# Patient Record
Sex: Male | Born: 1955 | Race: White | Hispanic: No | Marital: Single | Smoking: Former smoker
Health system: Southern US, Community
[De-identification: ages and names within clinical notes are randomized; demographics above are authoritative.]

---

## 2001-01-27 ENCOUNTER — Encounter: Payer: Self-pay | Admitting: *Deleted

## 2001-01-27 ENCOUNTER — Emergency Department (HOSPITAL_COMMUNITY): Admission: EM | Admit: 2001-01-27 | Discharge: 2001-01-27 | Payer: Self-pay | Admitting: *Deleted

## 2004-03-18 ENCOUNTER — Ambulatory Visit (HOSPITAL_COMMUNITY): Admission: RE | Admit: 2004-03-18 | Discharge: 2004-03-18 | Payer: Self-pay | Admitting: Internal Medicine

## 2004-05-13 ENCOUNTER — Ambulatory Visit (HOSPITAL_COMMUNITY): Admission: RE | Admit: 2004-05-13 | Discharge: 2004-05-13 | Payer: Self-pay | Admitting: Optometry

## 2004-10-29 ENCOUNTER — Inpatient Hospital Stay (HOSPITAL_COMMUNITY): Admission: EM | Admit: 2004-10-29 | Discharge: 2004-10-30 | Payer: Self-pay | Admitting: Emergency Medicine

## 2004-10-29 ENCOUNTER — Ambulatory Visit: Payer: Self-pay | Admitting: *Deleted

## 2005-09-15 IMAGING — CR DG CHEST 2V
2 series · 2 of 2 positions shown · non-contrast
Comparison: 05/13/04 chest CT scan.

CLINICAL DATA: Chest pain. 
 TWO VIEW CHEST ? 10/29/04:

[view not recorded (1 of 2)]
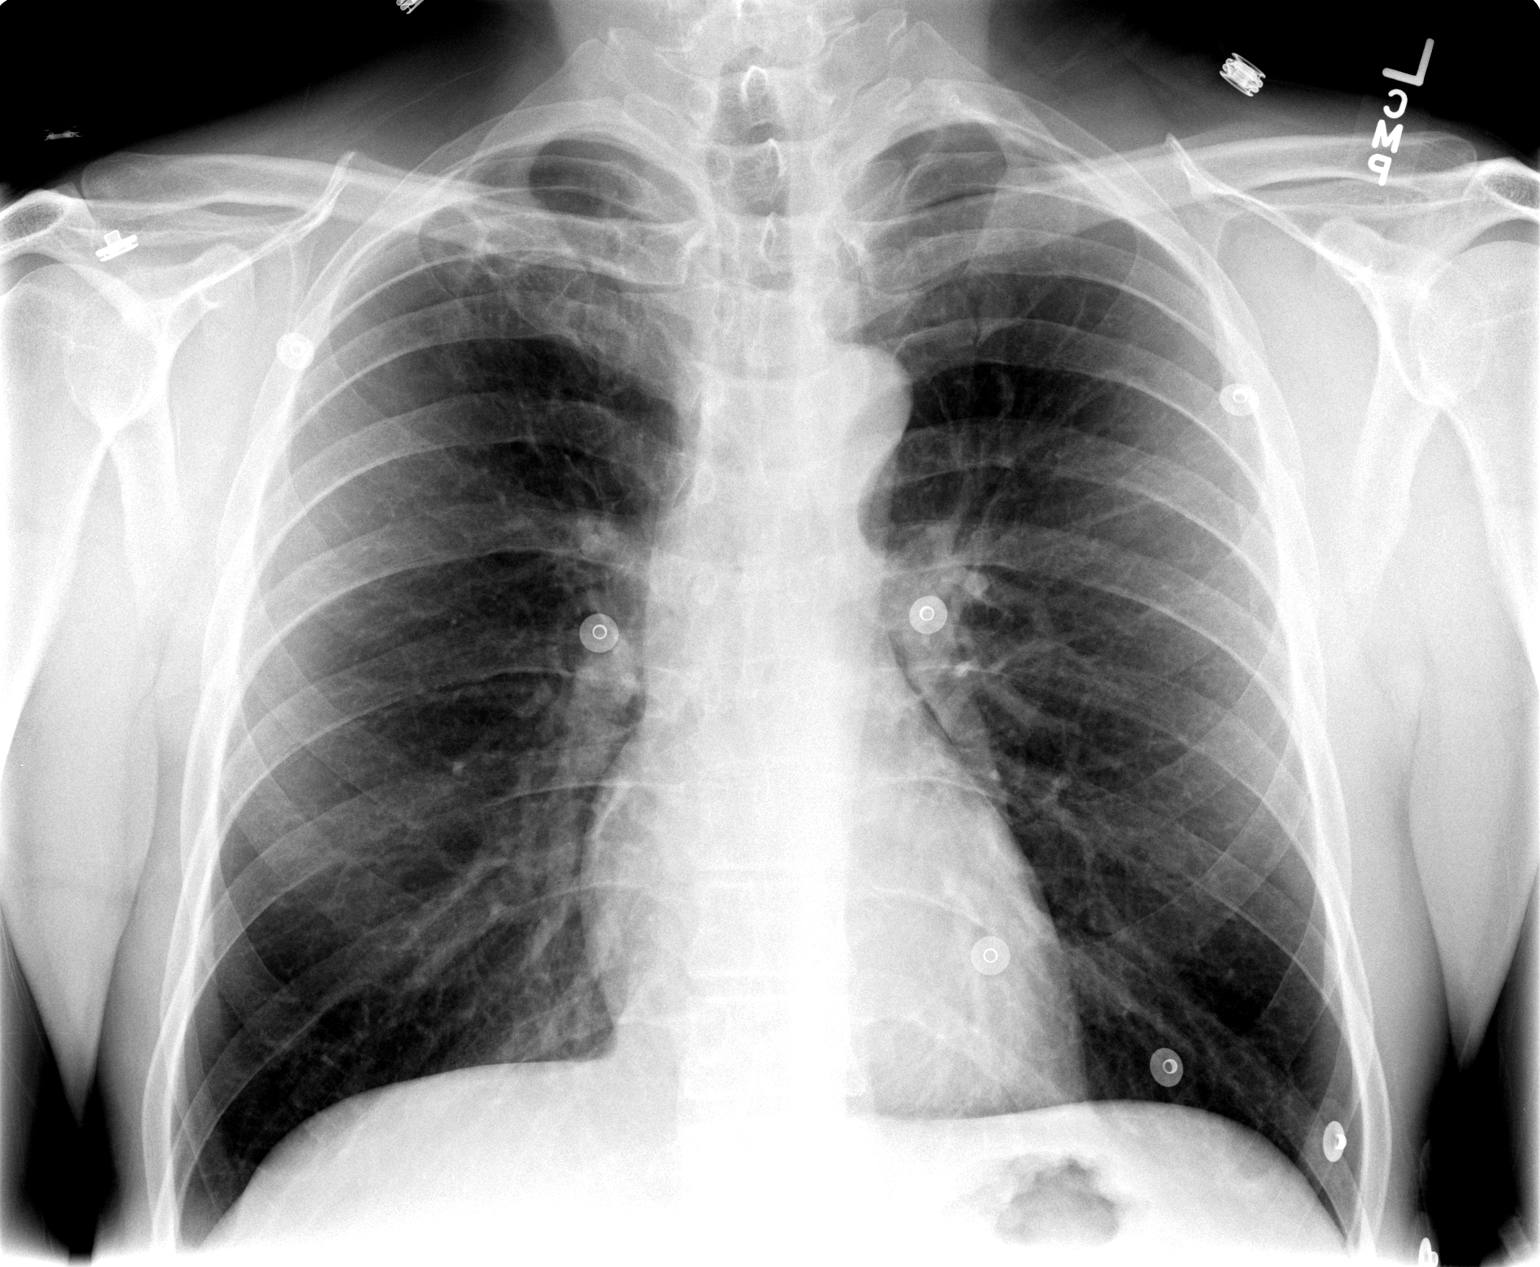

[view not recorded (2 of 2)]
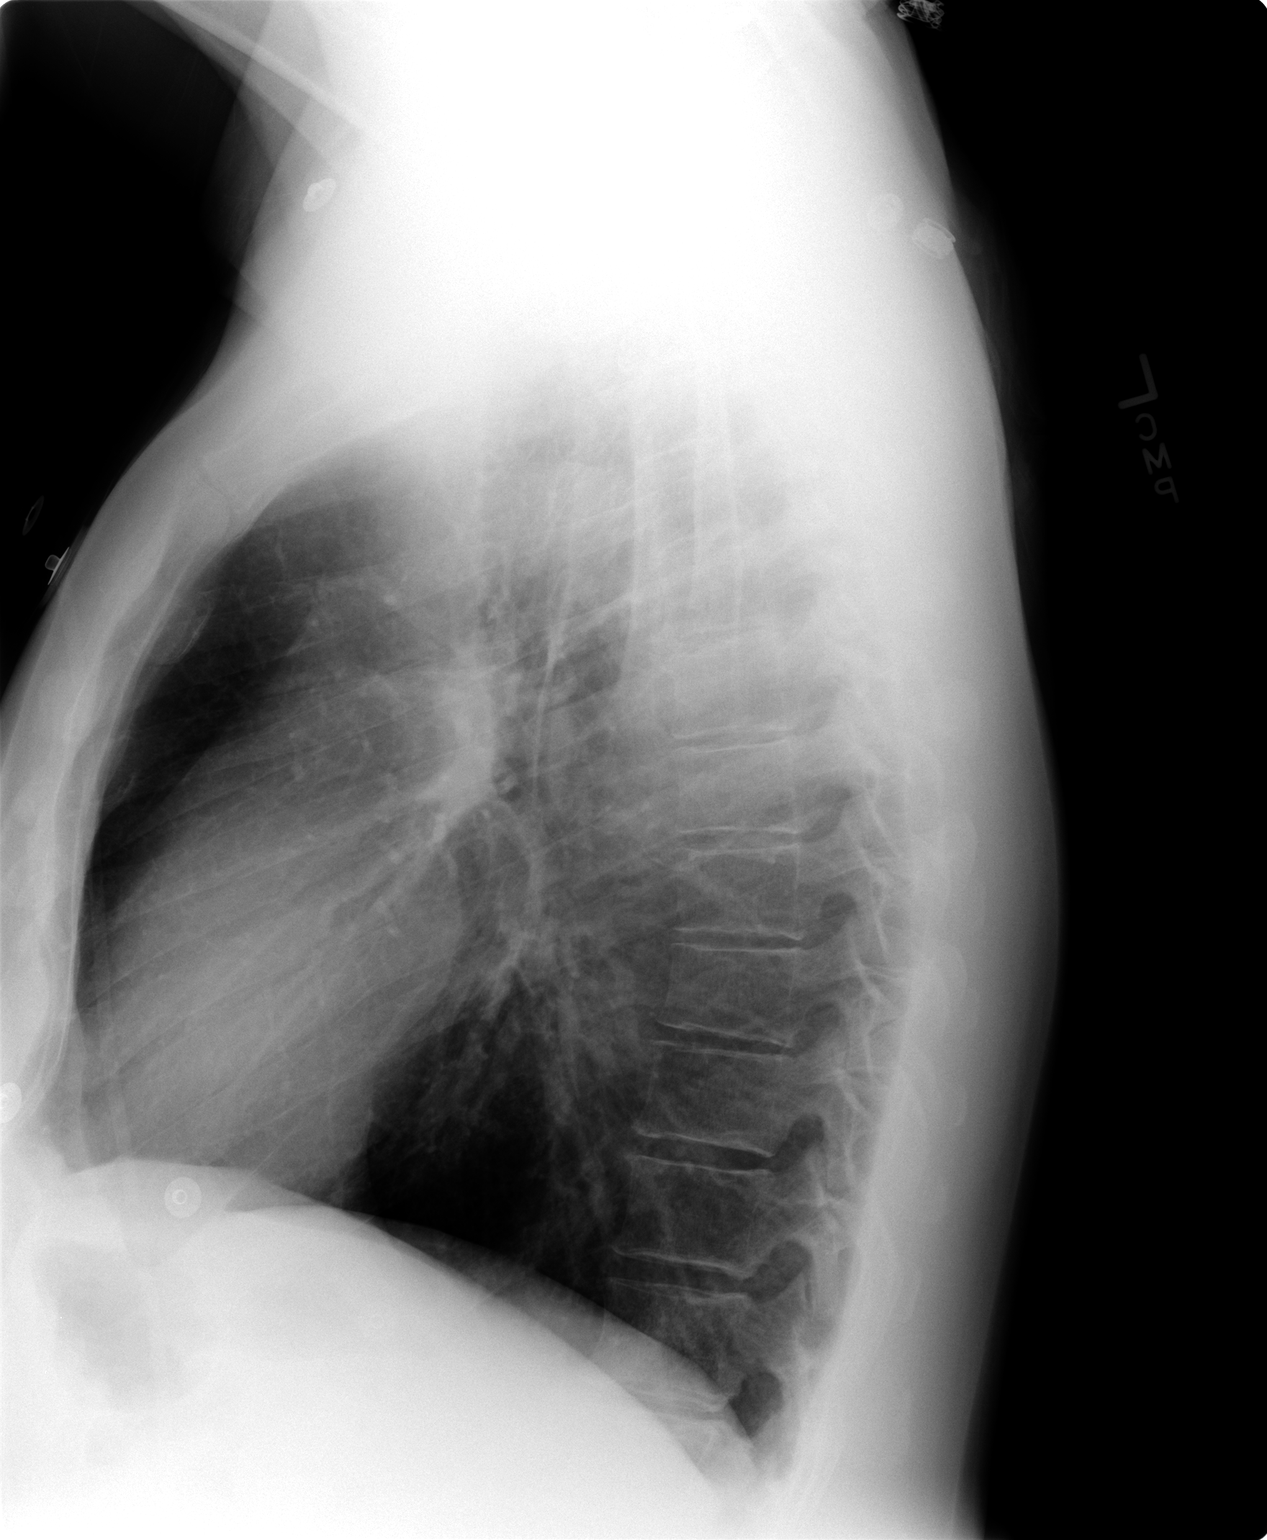

[2 of 2 positions shown; findings below may reference images not displayed]

The right upper lobe abscess cavity is markedly decreased in size and there is a small remaining air-filled bleb within the right lung apex.  There are no infiltrates.  The heart and mediastinal structures have a normal appearance.
IMPRESSION: 1.  Marked reduction in the cavitary area seen within the right lung apex with a small air-filled cystic area/bleb remaining within the right upper lobe.
 2.  No acute infiltrative or edematous changes.

## 2020-06-13 ENCOUNTER — Ambulatory Visit: Payer: Self-pay | Attending: Internal Medicine

## 2020-06-13 ENCOUNTER — Other Ambulatory Visit: Payer: Self-pay

## 2020-06-13 DIAGNOSIS — Z23 Encounter for immunization: Secondary | ICD-10-CM

## 2020-06-13 NOTE — Progress Notes (Signed)
   Covid-19 Vaccination Clinic  Name:  Victor Bowen    MRN: [id number] DOB: [birth year]  06/13/2020  Victor Bowen was observed post Covid-19 immunization for 15 minutes without incident. He was provided with Vaccine Information Sheet and instruction to access the V-Safe system.   Victor Bowen was instructed to call 911 with any severe reactions post vaccine: Marland Kitchen Difficulty breathing  . Swelling of face and throat  . A fast heartbeat  . A bad rash all over body  . Dizziness and weakness   Immunizations Administered    Name Date Dose VIS Date Route   Pfizer COVID-19 Vaccine 06/13/2020 10:54 AM 0.3 mL 11/16/2018 Intramuscular   Manufacturer: [employer], [employer]   Lot: [id number]   NDC: [address]-7

## 2020-07-04 ENCOUNTER — Other Ambulatory Visit: Payer: Self-pay

## 2020-07-04 ENCOUNTER — Ambulatory Visit: Payer: Self-pay | Attending: Internal Medicine

## 2020-07-04 DIAGNOSIS — Z23 Encounter for immunization: Secondary | ICD-10-CM

## 2020-07-04 NOTE — Progress Notes (Signed)
   Covid-19 Vaccination Clinic  Name:  Victor Bowen    MRN: [id number] DOB: Jan 17, 1956  07/04/2020  [name] was observed post Covid-19 immunization for 15 minutes without incident. He was provided with Vaccine Information Sheet and instruction to access the V-Safe system.   [name] was instructed to call 911 with any severe reactions post vaccine: Marland Kitchen Difficulty breathing  . Swelling of face and throat  . A fast heartbeat  . A bad rash all over body  . Dizziness and weakness   Immunizations Administered    Name Date Dose VIS Date Route   Pfizer COVID-19 Vaccine 07/04/2020 10:39 AM 0.3 mL 11/16/2018 Intramuscular   Manufacturer: [employer], [employer]   Lot: [id number]   NDC: [address]-9

## 2024-01-21 ENCOUNTER — Emergency Department (HOSPITAL_COMMUNITY): Payer: Self-pay

## 2024-01-21 ENCOUNTER — Other Ambulatory Visit: Payer: Self-pay

## 2024-01-21 ENCOUNTER — Encounter (HOSPITAL_COMMUNITY): Payer: Self-pay | Admitting: Emergency Medicine

## 2024-01-21 ENCOUNTER — Inpatient Hospital Stay (HOSPITAL_COMMUNITY)
Admission: EM | Admit: 2024-01-21 | Discharge: 2024-01-23 | DRG: 441 | Disposition: A | Discharge status: Hospice | Payer: Self-pay | Attending: Internal Medicine | Admitting: Internal Medicine

## 2024-01-21 ENCOUNTER — Inpatient Hospital Stay (HOSPITAL_COMMUNITY): Payer: Self-pay

## 2024-01-21 DIAGNOSIS — Z515 Encounter for palliative care: Secondary | ICD-10-CM | POA: Diagnosis not present

## 2024-01-21 DIAGNOSIS — I81 Portal vein thrombosis: Secondary | ICD-10-CM | POA: Diagnosis present

## 2024-01-21 DIAGNOSIS — C787 Secondary malignant neoplasm of liver and intrahepatic bile duct: Secondary | ICD-10-CM | POA: Diagnosis present

## 2024-01-21 DIAGNOSIS — I2489 Other forms of acute ischemic heart disease: Secondary | ICD-10-CM | POA: Diagnosis present

## 2024-01-21 DIAGNOSIS — C799 Secondary malignant neoplasm of unspecified site: Secondary | ICD-10-CM | POA: Diagnosis present

## 2024-01-21 DIAGNOSIS — K7290 Hepatic failure, unspecified without coma: Principal | ICD-10-CM

## 2024-01-21 DIAGNOSIS — E43 Unspecified severe protein-calorie malnutrition: Secondary | ICD-10-CM | POA: Diagnosis present

## 2024-01-21 DIAGNOSIS — J439 Emphysema, unspecified: Secondary | ICD-10-CM | POA: Diagnosis present

## 2024-01-21 DIAGNOSIS — E872 Acidosis, unspecified: Secondary | ICD-10-CM | POA: Insufficient documentation

## 2024-01-21 DIAGNOSIS — D72829 Elevated white blood cell count, unspecified: Secondary | ICD-10-CM | POA: Insufficient documentation

## 2024-01-21 DIAGNOSIS — C3490 Malignant neoplasm of unspecified part of unspecified bronchus or lung: Secondary | ICD-10-CM | POA: Diagnosis not present

## 2024-01-21 DIAGNOSIS — Z66 Do not resuscitate: Secondary | ICD-10-CM | POA: Diagnosis present

## 2024-01-21 DIAGNOSIS — Z6825 Body mass index (BMI) 25.0-25.9, adult: Secondary | ICD-10-CM

## 2024-01-21 DIAGNOSIS — E871 Hypo-osmolality and hyponatremia: Secondary | ICD-10-CM | POA: Diagnosis present

## 2024-01-21 DIAGNOSIS — R627 Adult failure to thrive: Secondary | ICD-10-CM | POA: Insufficient documentation

## 2024-01-21 DIAGNOSIS — Z87891 Personal history of nicotine dependence: Secondary | ICD-10-CM | POA: Diagnosis not present

## 2024-01-21 DIAGNOSIS — K763 Infarction of liver: Secondary | ICD-10-CM | POA: Diagnosis present

## 2024-01-21 DIAGNOSIS — K7200 Acute and subacute hepatic failure without coma: Principal | ICD-10-CM | POA: Diagnosis present

## 2024-01-21 DIAGNOSIS — R188 Other ascites: Secondary | ICD-10-CM | POA: Insufficient documentation

## 2024-01-21 DIAGNOSIS — R7989 Other specified abnormal findings of blood chemistry: Secondary | ICD-10-CM | POA: Insufficient documentation

## 2024-01-21 DIAGNOSIS — C7801 Secondary malignant neoplasm of right lung: Secondary | ICD-10-CM | POA: Diagnosis present

## 2024-01-21 DIAGNOSIS — D689 Coagulation defect, unspecified: Secondary | ICD-10-CM | POA: Insufficient documentation

## 2024-01-21 DIAGNOSIS — R590 Localized enlarged lymph nodes: Secondary | ICD-10-CM | POA: Diagnosis present

## 2024-01-21 DIAGNOSIS — J90 Pleural effusion, not elsewhere classified: Secondary | ICD-10-CM | POA: Diagnosis present

## 2024-01-21 DIAGNOSIS — R64 Cachexia: Secondary | ICD-10-CM | POA: Diagnosis present

## 2024-01-21 DIAGNOSIS — Z7189 Other specified counseling: Secondary | ICD-10-CM

## 2024-01-21 DIAGNOSIS — C3491 Malignant neoplasm of unspecified part of right bronchus or lung: Secondary | ICD-10-CM | POA: Diagnosis not present

## 2024-01-21 DIAGNOSIS — N179 Acute kidney failure, unspecified: Secondary | ICD-10-CM | POA: Diagnosis present

## 2024-01-21 DIAGNOSIS — F1010 Alcohol abuse, uncomplicated: Secondary | ICD-10-CM | POA: Diagnosis present

## 2024-01-21 DIAGNOSIS — J929 Pleural plaque without asbestos: Secondary | ICD-10-CM | POA: Insufficient documentation

## 2024-01-21 DIAGNOSIS — I871 Compression of vein: Secondary | ICD-10-CM | POA: Insufficient documentation

## 2024-01-21 LAB — COMPREHENSIVE METABOLIC PANEL WITH GFR
ALT: 263 U/L — ABNORMAL HIGH (ref 0–44)
AST: 508 U/L — ABNORMAL HIGH (ref 15–41)
Albumin: 2.4 g/dL — ABNORMAL LOW (ref 3.5–5.0)
Alkaline Phosphatase: 1495 U/L — ABNORMAL HIGH (ref 38–126)
Anion gap: 21 — ABNORMAL HIGH (ref 5–15)
BUN: 66 mg/dL — ABNORMAL HIGH (ref 8–23)
CO2: 14 mmol/L — ABNORMAL LOW (ref 22–32)
Calcium: 9.3 mg/dL (ref 8.9–10.3)
Chloride: 88 mmol/L — ABNORMAL LOW (ref 98–111)
Creatinine, Ser: 1.62 mg/dL — ABNORMAL HIGH (ref 0.61–1.24)
GFR, Estimated: 46 mL/min — ABNORMAL LOW (ref >60)
Glucose, Bld: 92 mg/dL (ref 70–99)
Potassium: 5.1 mmol/L (ref 3.5–5.1)
Sodium: 123 mmol/L — ABNORMAL LOW (ref 135–145)
Total Bilirubin: 20.2 mg/dL (ref 0.0–1.2)
Total Protein: 6.2 g/dL — ABNORMAL LOW (ref 6.5–8.1)

## 2024-01-21 LAB — CBC WITH DIFFERENTIAL/PLATELET
Abs Immature Granulocytes: 0.62 10*3/uL — ABNORMAL HIGH (ref 0.00–0.07)
Basophils Absolute: 0.1 10*3/uL (ref 0.0–0.1)
Basophils Relative: 0 %
Eosinophils Absolute: 0 10*3/uL (ref 0.0–0.5)
Eosinophils Relative: 0 %
HCT: 37.8 % — ABNORMAL LOW (ref 39.0–52.0)
Hemoglobin: 12.8 g/dL — ABNORMAL LOW (ref 13.0–17.0)
Immature Granulocytes: 4 %
Lymphocytes Relative: 10 %
Lymphs Abs: 1.7 10*3/uL (ref 0.7–4.0)
MCH: 29.8 pg (ref 26.0–34.0)
MCHC: 33.9 g/dL (ref 30.0–36.0)
MCV: 88.1 fL (ref 80.0–100.0)
Monocytes Absolute: 1.3 10*3/uL — ABNORMAL HIGH (ref 0.1–1.0)
Monocytes Relative: 8 %
Neutro Abs: 13.4 10*3/uL — ABNORMAL HIGH (ref 1.7–7.7)
Neutrophils Relative %: 78 %
Platelets: 295 10*3/uL (ref 150–400)
RBC: 4.29 MIL/uL (ref 4.22–5.81)
RDW: 20.6 % — ABNORMAL HIGH (ref 11.5–15.5)
WBC: 17.1 10*3/uL — ABNORMAL HIGH (ref 4.0–10.5)
nRBC: 0.1 % (ref 0.0–0.2)

## 2024-01-21 LAB — OSMOLALITY: Osmolality: 297 mosm/kg — ABNORMAL HIGH (ref 275–295)

## 2024-01-21 LAB — URINALYSIS, ROUTINE W REFLEX MICROSCOPIC
Bacteria, UA: NONE SEEN
Glucose, UA: NEGATIVE mg/dL
Hgb urine dipstick: NEGATIVE
Ketones, ur: 5 mg/dL — AB
Leukocytes,Ua: NEGATIVE
Nitrite: NEGATIVE
Protein, ur: 30 mg/dL — AB
Specific Gravity, Urine: 1.035 — ABNORMAL HIGH (ref 1.005–1.030)
pH: 5 (ref 5.0–8.0)

## 2024-01-21 LAB — BLOOD GAS, VENOUS
Acid-base deficit: 10 mmol/L — ABNORMAL HIGH (ref 0.0–2.0)
Bicarbonate: 13.8 mmol/L — ABNORMAL LOW (ref 20.0–28.0)
Drawn by: 7012
O2 Saturation: 93.5 %
Patient temperature: 36.7
pCO2, Ven: 25 mmHg — ABNORMAL LOW (ref 44–60)
pH, Ven: 7.35 (ref 7.25–7.43)
pO2, Ven: 65 mmHg — ABNORMAL HIGH (ref 32–45)

## 2024-01-21 LAB — BRAIN NATRIURETIC PEPTIDE: B Natriuretic Peptide: 126 pg/mL — ABNORMAL HIGH (ref 0.0–100.0)

## 2024-01-21 LAB — LACTIC ACID, PLASMA
Lactic Acid, Venous: 4.9 mmol/L (ref 0.5–1.9)
Lactic Acid, Venous: 4.9 mmol/L (ref 0.5–1.9)

## 2024-01-21 LAB — PROTIME-INR
INR: 1.3 — ABNORMAL HIGH (ref 0.8–1.2)
Prothrombin Time: 16.6 s — ABNORMAL HIGH (ref 11.4–15.2)

## 2024-01-21 LAB — SODIUM, URINE, RANDOM: Sodium, Ur: 18 mmol/L

## 2024-01-21 LAB — TROPONIN I (HIGH SENSITIVITY)
Troponin I (High Sensitivity): 20 ng/L — ABNORMAL HIGH (ref <18)
Troponin I (High Sensitivity): 18 ng/L — ABNORMAL HIGH (ref <18)

## 2024-01-21 MED ORDER — GADOBUTROL 1 MMOL/ML IV SOLN
8.0000 mL | Freq: Once | INTRAVENOUS | Status: AC | PRN
  Administered 2024-01-21: 8 mL via INTRAVENOUS

## 2024-01-21 MED ORDER — LORAZEPAM 0.5 MG PO TABS
0.5000 mg | ORAL_TABLET | Freq: Four times a day (QID) | ORAL | Status: DC | PRN
  Administered 2024-01-21 – 2024-01-22 (×2): 0.5 mg via ORAL
  Filled 2024-01-21 (×2): qty 1

## 2024-01-21 MED ORDER — ALBUTEROL SULFATE (2.5 MG/3ML) 0.083% IN NEBU: 2.5000 mg | INHALATION_SOLUTION | RESPIRATORY_TRACT | Status: DC | PRN

## 2024-01-21 MED ORDER — GLYCOPYRROLATE 1 MG PO TABS: 1.0000 mg | ORAL_TABLET | ORAL | Status: DC | PRN

## 2024-01-21 MED ORDER — GLYCOPYRROLATE 0.2 MG/ML IJ SOLN: 0.2000 mg | INTRAMUSCULAR | Status: DC | PRN

## 2024-01-21 MED ORDER — HYDROMORPHONE BOLUS VIA INFUSION: 0.5000 mg | INTRAVENOUS | Status: DC | PRN

## 2024-01-21 MED ORDER — IOHEXOL 300 MG/ML  SOLN
75.0000 mL | Freq: Once | INTRAMUSCULAR | Status: AC | PRN
  Administered 2024-01-21: 75 mL via INTRAVENOUS

## 2024-01-21 MED ORDER — ONDANSETRON 4 MG PO TBDP
4.0000 mg | ORAL_TABLET | Freq: Four times a day (QID) | ORAL | Status: DC | PRN
  Administered 2024-01-23: 4 mg via ORAL
  Filled 2024-01-21: qty 1

## 2024-01-21 MED ORDER — PIPERACILLIN-TAZOBACTAM 3.375 G IVPB
3.3750 g | Freq: Three times a day (TID) | INTRAVENOUS | Status: DC
  Administered 2024-01-21 – 2024-01-22 (×3): 3.375 g via INTRAVENOUS
  Filled 2024-01-21 (×3): qty 50

## 2024-01-21 MED ORDER — HYDROMORPHONE HCL 1 MG/ML IJ SOLN
0.5000 mg | INTRAMUSCULAR | Status: DC | PRN
  Administered 2024-01-22 – 2024-01-23 (×4): 0.5 mg via INTRAVENOUS
  Filled 2024-01-21 (×4): qty 0.5

## 2024-01-21 MED ORDER — ACETAMINOPHEN 325 MG PO TABS: 650.0000 mg | ORAL_TABLET | Freq: Four times a day (QID) | ORAL | Status: DC | PRN

## 2024-01-21 MED ORDER — PANTOPRAZOLE SODIUM 40 MG PO TBEC
40.0000 mg | DELAYED_RELEASE_TABLET | Freq: Every day | ORAL | Status: DC
  Administered 2024-01-21 – 2024-01-23 (×2): 40 mg via ORAL
  Filled 2024-01-21 (×2): qty 1

## 2024-01-21 MED ORDER — ACETAMINOPHEN 650 MG RE SUPP: 650.0000 mg | Freq: Four times a day (QID) | RECTAL | Status: DC | PRN

## 2024-01-21 MED ORDER — ONDANSETRON HCL 4 MG/2ML IJ SOLN: 4.0000 mg | Freq: Four times a day (QID) | INTRAMUSCULAR | Status: DC | PRN

## 2024-01-21 MED ORDER — POLYVINYL ALCOHOL 1.4 % OP SOLN: 1.0000 [drp] | Freq: Four times a day (QID) | OPHTHALMIC | Status: DC | PRN

## 2024-01-21 NOTE — Hospital Course (Addendum)
 68 year old male with significant history of smoking-30 pack years quit about 3 and half years ago and alcohol  use quit few months ago, no known past medical history but has not seen doctor in many years presenting for evaluation of several months of exertional dyspnea, cough abdominal bloating and yesterday noticed loose discoloration of skin and leg edema.  Patient went to urgent care twice and was given antibiotics without relief of symptoms and presented to the ED. Patient appears swollen and bloated appears uncomfortable, cachectic and pain.  Endorses weight loss. He has abdominal discomfort.Denies any nausea vomiting fever chills focal weakness. In the ED: Vitals-hemodynamically stable afebrile.Labs as below showing hyponatremia acute renal failure, transaminitis jaundice,hyponatremia : Na 123 potassium 5.1 bicarb 14 BUN 66 and creatinine 1.6 alk phos 1495 AST 508 ALT 263 total bili 20.2, CBC with leukocytosis 17K stable hb/platelet, VBG 7.3 pCO2 25. Patient appeared jaundiced with leg edema. 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 His MELD score around 31 per EDP Patient and sister at bedside after hearing the findings-does not want to go through any biopsy or bronchoscopy and wants to be comfortable DNR.Hospitalist was consulted. GI Dr. Andy Keen was consulted>  MRCP obtained to see if any role of stenting of bile duct-empiric Zosyn  was initiated pending MRI. Palliative care was consulted as well. 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  Portal lymphadenopathy noticed> discussed with GI no role of further testing or procedure at this time and advised hospice. Patient and family agreed for hospice [facility] consulted for hospice referral after extensive palliative care evaluation

## 2024-01-21 NOTE — ED Triage Notes (Signed)
 Pt c/o ongoing shob x 1 month, was previously Dx with an URI and has completed all ABX for that, but states it has not resolved his shob. Pt also c/o bilateral lower extremity swelling x "few days", 3+ pitting edema noted to bilateral lower legs.pt also appears jaundice upon visual assessment, when asked if pt has any liver issues etc.--Pt denies, Family member at bedside states "I just notcied his yellow skin yesterday". Denies CP with the exception of when he coughs, endorses dizziness upon standing

## 2024-01-21 NOTE — ED Notes (Signed)
 Pt went to quick care prior to coming to ER, paperwork is in the pt's room with the dx.

## 2024-01-21 NOTE — Consult Note (Signed)
 Consultation Note Date: 01/21/2024   Patient Name: Victor Bowen  DOB: [birth year]  MRN: [id number]  Age / Sex: 68 y.o., male  PCP: Pcp, No Referring Physician: Lesa Rape, MD  Reason for Consultation: Establishing goals of care  HPI/Patient Profile: 68 y.o. male  with past medical history of smoking-30 pack years quit about 3 and half years ago and alcohol  use quit few months ago, no known past medical history  admitted on 01/21/2024 with acute liver failure, metastatic lung cancer.   Clinical Assessment and Goals of Care: I have reviewed medical records including EPIC notes, labs and imaging, received report from RN, assessed the patient.  Mr. Moga is sitting up in the [facility] chair in the ED.  He greets me, making and mostly keeping eye contact.  He is alert and oriented, able to make his needs known.  His sister, Mitzi Ancona, and his longtime friend, Nadia Aurora, are present at bedside.  We meet at the bedside to discuss diagnosis prognosis, GOC, EOL wishes, disposition and options.  I introduced Palliative Medicine as specialized medical care for people living with serious illness. It focuses on providing relief from the symptoms and stress of a serious illness. The goal is to improve quality of life for both the patient and the family.  We focused on their current illness.  We talk in detail about Mr. Hames hard news today.  We talked about images showing right lower lobe lung cancer and what is considered to be diffuse liver metastasis.  We talk in detail about the choice for MRI/MRCP that can be done here at [facility].  I shared that the diagnostic images will show if his bile duct would accept a palliative stent to improve his quality of life.  I shared that stenting must be done in [place], but he could return to [facility].  We also talked about diagnostics to understand more  about main portal vein occlusion whether this is from cancer or a blood clot.  I shared that part of his liver is dying, that if the blood vessel is being cut off by tumor that time is likely short, but if there is a blood clot closing the blood vessel then this may be treated with blood thinners.  We talked about time for diagnostics and options.  Family is tearful but knowledgeable about options and the treatment plan the natural disease trajectory and expectations at EOL were discussed.  Advanced directives, concepts specific to code status, artifical feeding and hydration, and rehospitalization were considered and discussed.  Open to some diagnostics, but no further invasive cancer testing at this point.  Anticipate open to stenting if will provide benefit.  Hospice and Palliative Care services outpatient were explained and offered.  Mr. Standing states that he has a friend who works at hospice.  Family and friend shared that they have experience with hospice services.  We talked about provider choice.  They choose [facility].  We talked about the benefits of residential hospice if/when needed.  Discussed the importance of continued conversation with family and the medical providers regarding overall plan of care and treatment options, ensuring decisions are within the context of the patient's values and GOCs. Questions and concerns were addressed.  The family was encouraged to call with questions or concerns.  PMT will continue to support holistically.  Conference with attending, bedside nursing staff, transition of care team related to patient condition, needs, goals of care, disposition.   HCPOA NEXT OF KIN -sisters Alethea Hutchinson and Edwina Gram    SUMMARY OF RECOMMENDATIONS   DNR verified Open to MRI/MRCP for palliative treatment Home with the benefit of [facility] hospice care    Code Status/Advance Care Planning: DNR  Symptom Management:  Per hospitalist, no additional needs at this time.  Palliative  Prophylaxis:  Frequent Pain Assessment and Oral Care  Additional Recommendations (Limitations, Scope, Preferences): No further cancer diagnostics but open to possible common bile duct stenting.  DNR  Psycho-social/Spiritual:  Desire for further Chaplaincy support:no Additional Recommendations: Caregiving  Support/Resources and Grief/Bereavement Support  Prognosis:  < 3 months, would not be surprising based on acuity of condition.  If tumor, not blood clot, is occluding portal vein then weeks anticipated.  Discharge Planning: Home with Hospice      Primary Diagnoses: Present on Admission:  Metastatic disease (HCC)  Acute liver failure   I have reviewed the medical record, interviewed the patient and family, and examined the patient. The following aspects are pertinent.  History reviewed. No pertinent past medical history. Social History   Socioeconomic History   Marital status: Single    Spouse name: Not on file   Number of children: Not on file   Years of education: Not on file   Highest education level: Not on file  Occupational History   Not on file  Tobacco Use   Smoking status: Former    Types: Cigarettes   Smokeless tobacco: Not on file  Substance and Sexual Activity   Alcohol  use: Not on file   Drug use: Not on file   Sexual activity: Not on file  Other Topics Concern   Not on file  Social History Narrative   Not on file   Social Drivers of Health   Financial Resource Strain: Not on file  Food Insecurity: Not on file  Transportation Needs: Not on file  Physical Activity: Not on file  Stress: Not on file  Social Connections: Not on file   History reviewed. No pertinent family history. Scheduled Meds:  pantoprazole   40 mg Oral Daily   Continuous Infusions: PRN Meds:.acetaminophen  **OR** acetaminophen , albuterol , glycopyrrolate  **OR** glycopyrrolate  **OR** glycopyrrolate , HYDROmorphone  (DILAUDID ) injection, LORazepam , ondansetron  **OR** ondansetron   (ZOFRAN ) IV, polyvinyl alcohol  Medications Prior to Admission:  Prior to Admission medications   Not on File   Not on File Review of Systems  Unable to perform ROS: Acuity of condition    Physical Exam Vitals and nursing note reviewed.  Constitutional:      General: He is not in acute distress.    Appearance: He is ill-appearing.  HENT:     Head: Normocephalic and atraumatic.  Cardiovascular:     Rate and Rhythm: Normal rate.  Pulmonary:     Effort: Pulmonary effort is normal. No tachypnea.  Skin:    General: Skin is warm and dry.     Comments: Jaundiced  Neurological:     Mental Status: He is alert and oriented to person, place, and time.  Psychiatric:        Mood and  Affect: Mood normal.        Behavior: Behavior normal.     Vital Signs: BP (!) 146/85   Pulse 86   Temp 98.5 F (36.9 C)   Resp 20   Ht 5' 10.5" (1.791 m)   Wt 81.6 kg   SpO2 91%   BMI 25.46 kg/m  Pain Scale: 0-10   Pain Score: 0-No pain   SpO2: SpO2: 91 % O2 Device:SpO2: 91 % O2 Flow Rate: .   IO: Intake/output summary: No intake or output data in the 24 hours ending 01/21/24 1543  LBM:   Baseline Weight: Weight: 81.6 kg Most recent weight: Weight: 81.6 kg     Palliative Assessment/Data:     Time In: 1500  Time Out: 1615 Time Total: 75 minutes  Greater than 50%  of this time was spent counseling and coordinating care related to the above assessment and plan.  Signed by: Annabelle Barrack, NP   Please contact Palliative Medicine Team phone at [phone] for questions and concerns.  For individual provider: See Tilford Foley

## 2024-01-21 NOTE — ED Provider Notes (Signed)
 [place] EMERGENCY DEPARTMENT AT [facility] Provider Note   CSN: [id number] Arrival date & time: 01/21/24  0940     History  Chief Complaint  Patient presents with   Shortness of Breath    Victor Bowen is a 68 y.o. male. He has no known chronic medical conditions but does not get regular medical care.  He is here for evaluation today of several months of exertional dyspnea and cough, with abdominal bloating as well without pain.  He noticed yesterday that his skin was yellow and this morning woke up with lower extremity edema bilaterally.  No chest pain, no fevers.  He has had a cough productive of clear sputum.  He went to urgent care twice and states he was given antibiotics without relief of symptoms.  Patient notes he was a long-term smoker but quit about 2 and half years ago.  He also previously drank about 6 beers a day until approximately 2 months ago when he stopped.   Shortness of Breath      Home Medications Prior to Admission medications   Not on File      Allergies    Patient has no allergy information on record.    Review of Systems   Review of Systems  Respiratory:  Positive for shortness of breath.     Physical Exam Updated Vital Signs BP (!) 146/85   Pulse 86   Temp 98.5 F (36.9 C)   Resp 20   Ht 5' 10.5" (1.791 m)   Wt 81.6 kg   SpO2 91%   BMI 25.46 kg/m  Physical Exam Vitals and nursing note reviewed.  Constitutional:      General: He is not in acute distress.    Appearance: He is well-developed.  HENT:     Head: Normocephalic and atraumatic.     Mouth/Throat:     Mouth: Mucous membranes are moist.     Dentition: Dental caries present.     Pharynx: Oropharynx is clear.  Eyes:     Extraocular Movements: Extraocular movements intact.     Conjunctiva/sclera: Conjunctivae normal.     Pupils: Pupils are equal, round, and reactive to light.  Cardiovascular:     Rate and Rhythm: Normal rate and regular rhythm.     Heart  sounds: No murmur heard. Pulmonary:     Effort: Pulmonary effort is normal. No respiratory distress.     Breath sounds: Normal breath sounds.  Abdominal:     Palpations: Abdomen is soft.     Tenderness: There is no abdominal tenderness.  Musculoskeletal:        General: No swelling.     Cervical back: Full passive range of motion without pain and neck supple.     Right lower leg: No tenderness. Edema present.     Left lower leg: No tenderness. Edema present.  Lymphadenopathy:     Cervical: No cervical adenopathy.  Skin:    General: Skin is warm and dry.     Capillary Refill: Capillary refill takes less than 2 seconds.     Coloration: Skin is jaundiced.     Findings: No ecchymosis.     Nails: There is no clubbing.  Neurological:     General: No focal deficit present.     Mental Status: He is alert and oriented to person, place, and time.  Psychiatric:        Mood and Affect: Mood normal.     ED Results / Procedures / Treatments  Labs (all labs ordered are listed, but only abnormal results are displayed) Labs Reviewed  COMPREHENSIVE METABOLIC PANEL WITH GFR - Abnormal; Notable for the following components:      Result Value   Sodium 123 (*)    Chloride 88 (*)    CO2 14 (*)    BUN 66 (*)    Creatinine, Ser 1.62 (*)    Total Protein 6.2 (*)    Albumin 2.4 (*)    AST 508 (*)    ALT 263 (*)    Alkaline Phosphatase 1,495 (*)    Total Bilirubin 20.2 (*)    GFR, Estimated 46 (*)    Anion gap 21 (*)    All other components within normal limits  CBC WITH DIFFERENTIAL/PLATELET - Abnormal; Notable for the following components:   WBC 17.1 (*)    Hemoglobin 12.8 (*)    HCT 37.8 (*)    RDW 20.6 (*)    Neutro Abs 13.4 (*)    Monocytes Absolute 1.3 (*)    Abs Immature Granulocytes 0.62 (*)    All other components within normal limits  BRAIN NATRIURETIC PEPTIDE - Abnormal; Notable for the following components:   B Natriuretic Peptide 126.0 (*)    All other components within  normal limits  LACTIC ACID, PLASMA - Abnormal; Notable for the following components:   Lactic Acid, Venous 4.9 (*)    All other components within normal limits  LACTIC ACID, PLASMA - Abnormal; Notable for the following components:   Lactic Acid, Venous 4.9 (*)    All other components within normal limits  PROTIME-INR - Abnormal; Notable for the following components:   Prothrombin Time 16.6 (*)    INR 1.3 (*)    All other components within normal limits  BLOOD GAS, VENOUS - Abnormal; Notable for the following components:   pCO2, Ven 25 (*)    pO2, Ven 65 (*)    Bicarbonate 13.8 (*)    Acid-base deficit 10.0 (*)    All other components within normal limits  URINALYSIS, ROUTINE W REFLEX MICROSCOPIC - Abnormal; Notable for the following components:   Color, Urine AMBER (*)    Specific Gravity, Urine 1.035 (*)    Bilirubin Urine MODERATE (*)    Ketones, ur 5 (*)    Protein, ur 30 (*)    All other components within normal limits  OSMOLALITY - Abnormal; Notable for the following components:   Osmolality 297 (*)    All other components within normal limits  TROPONIN I (HIGH SENSITIVITY) - Abnormal; Notable for the following components:   Troponin I (High Sensitivity) 20 (*)    All other components within normal limits  TROPONIN I (HIGH SENSITIVITY) - Abnormal; Notable for the following components:   Troponin I (High Sensitivity) 18 (*)    All other components within normal limits  SODIUM, URINE, RANDOM    EKG EKG Interpretation Date/Time:  Thursday Jan 21 2024 09:52:31 EDT Ventricular Rate:  79 PR Interval:  150 QRS Duration:  97 QT Interval:  359 QTC Calculation: 412 R Axis:   75  Text Interpretation: Sinus rhythm Borderline T abnormalities, inferior leads Confirmed by Kommor, Madison ([id number]) on 01/21/2024 10:10:07 AM  Radiology CT CHEST ABDOMEN PELVIS W CONTRAST Result Date: 01/21/2024 CLINICAL DATA:  Shortness of breath. Concern for a mass at the right lung base on recent  chest radiograph. Metastatic disease evaluation. EXAM: CT CHEST, ABDOMEN, AND PELVIS WITH CONTRAST TECHNIQUE: Multidetector CT imaging of the chest, abdomen and pelvis  was performed following the standard protocol during bolus administration of intravenous contrast. RADIATION DOSE REDUCTION: This exam was performed according to the departmental dose-optimization program which includes automated exposure control, adjustment of the mA and/or kV according to patient size and/or use of iterative reconstruction technique. CONTRAST:  75mL OMNIPAQUE  IOHEXOL  300 MG/ML  SOLN COMPARISON:  Chest radiograph 01/21/2024.  CT chest 05/13/2024 FINDINGS: CT CHEST FINDINGS Cardiovascular: Atherosclerotic calcifications in thoracic aorta without aneurysm. Coronary artery calcifications. Heart size is normal. No significant pericardial effusion. Mediastinum/Nodes: Enlarged mediastinal and right hilar lymphadenopathy. Right hilar nodal tissue measures 2.4 cm in the short axis on image 30/2. Enlarged subcarinal tissue measures 2.3 cm in the short axis. Pretracheal soft tissue measures 2.3 cm in the short axis on image 20/2. Mildly prominent right supraclavicular lymph nodes. No axillary lymph node enlargement. Enlarged lymph nodes in the right cardiophrenic region on image 46/2. Lungs/Pleura: Large irregular mass in the right lower lobe measures 7.5 x 5.8 x 5.0 cm. Focal fluid or low-density along the periphery of the mass adjacent to a small right pleural effusion. Centrilobular emphysema. Parenchymal architecture distortion at the right lung apex is likely chronic based on the exam from 2005. Concern for pleural-based nodular densities along the right hemidiaphragm on image 119/4, image 114, image 113 and image 112. Probable adherent mucus along the right lower aspect of the trachea on image 49/4. Right lower lobe airways extend directly into the large right lower lobe mass. Tiny focus of pleural nodularity in the right upper lobe on  image 79/4 and there is focal pleural thickening adjacent to the right sixth rib on image 85/4. Musculoskeletal: Pleural thickening adjacent to the lateral right sixth rib. Question a small focus of sclerosis in the right sixth rib. Low-density superficial nodular structure in the left posterior back on image 23/2 measures up to 3.2 cm and suspect this is a sebaceous cyst. CT ABDOMEN PELVIS FINDINGS Hepatobiliary: Liver is enlarged and very heterogeneous. Liver measures 26.9 cm in craniocaudal dimension. Small amount of perihepatic fluid. Large geographic areas of low-density in the right hepatic lobe and involving segment 4. These areas of low-density persist on the delayed images along the periphery and raise concern for areas of infarct. Heterogeneity in the left hepatic lobe is concerning for an infiltrative process and suspect diffuse metastatic disease. Main portal vein and left portal vein are patent. No significant flow in the right portal vein. 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 Gallbladder is decompressed with stones. Small amount of fluid or thickening around the gallbladder but poorly characterized. Pancreas: Unremarkable. No pancreatic ductal dilatation or surrounding inflammatory changes. Spleen: Spleen is for normal for size. No gross abnormality to the spleen. Trace fluid around the spleen. Adrenals/Urinary Tract: Normal adrenal glands. Normal appearance of both kidneys without hydronephrosis. No suspicious renal lesion. Exophytic low-density cyst in left kidney lower pole does not require dedicated follow-up. Mild bladder wall thickening is nonspecific. Stomach/Bowel: Normal appearance of the stomach. No significant bowel dilatation. No evidence for bowel obstruction. Vascular/Lymphatic: Atherosclerotic calcifications in the abdominal aorta without aneurysm. No significant lymph node enlargement in the abdomen or pelvis. Concern for right portal vein occlusion as described in the  hepatobiliary section. Prominent lymph nodes in the periportal and precaval region on image 68/2 and image 73/2. Reproductive: Prostate is prominent with calcifications. Other: Small amount of free fluid in the pelvis. Small amount of fluid around the liver and spleen. No evidence for free air. Musculoskeletal: No acute bone abnormality. IMPRESSION: 1.  Large mass in the right lower lobe measuring up to 7.5 cm. Extensive mediastinal and right hilar lymphadenopathy. There is also right chest pleural nodularity. Findings are suggestive for a primary lung neoplasm with metastatic disease. 2. Liver is enlarged and very heterogeneous. Suspect these findings are related to diffuse metastatic disease within the liver. The right hepatic lobe is significantly enlarged and has areas of persistent low density along the periphery on the delayed images. These findings are concerning for areas of hepatic infarction secondary to right portal vein occlusion. Right portal vein occlusion may be secondary to external compression rather than tumor thrombus. 3. Enlarged lymph nodes in the upper abdomen. Question slightly enlarged right supraclavicular lymph nodes. 4. Pleural thickening adjacent to the right sixth rib. Favor pleural disease rather than a bone lesion. 5. Trace right pleural fluid. 6. Small amount of ascites. 7.  Emphysema (ICD10-J43.9). 8.  Aortic Atherosclerosis (ICD10-I70.0). These results were called by telephone at the time of interpretation on 01/21/2024 at 2:01 pm to provider Hakop Humbarger , who verbally acknowledged these results. Electronically Signed   By: Elene Griffes M.D.   On: 01/21/2024 14:07   DG Chest Portable 1 View Result Date: 01/21/2024 CLINICAL DATA:  dyspnea EXAM: PORTABLE CHEST - 1 VIEW COMPARISON:  None available. FINDINGS: Small right pleural effusion with right basilar airspace opacities, likely atelectasis. No pneumothorax. Masslike opacity in the medial right lung base measuring 5.5 cm. 1 cm  nodular opacity in the right upper lung zone. Thickening of the right paratracheal stripe. No cardiomegaly. No acute fracture or destructive lesion. IMPRESSION: 1. Rounded masslike opacity in the medial right lung base measuring 5.5 cm. A follow-up chest CT is recommended to evaluate for possible neoplasm. 2. Thickening of the right paratracheal stripe, which can be seen in mediastinal lymphadenopathy. 1 cm nodule in the right lung apex. These findings should also be further evaluated on the aforementioned chest CT. 3. Small right pleural effusion with right basilar airspace opacities, likely atelectasis. Electronically Signed   By: Rance Burrows M.D.   On: 01/21/2024 10:59    Procedures Procedures    Medications Ordered in ED Medications  acetaminophen  (TYLENOL ) tablet 650 mg (has no administration in time range)    Or  acetaminophen  (TYLENOL ) suppository 650 mg (has no administration in time range)  glycopyrrolate  (ROBINUL ) tablet 1 mg (has no administration in time range)    Or  glycopyrrolate  (ROBINUL ) injection 0.2 mg (has no administration in time range)    Or  glycopyrrolate  (ROBINUL ) injection 0.2 mg (has no administration in time range)  polyvinyl alcohol  (LIQUIFILM TEARS) 1.4 % ophthalmic solution 1 drop (has no administration in time range)  LORazepam  (ATIVAN ) tablet 0.5 mg (has no administration in time range)  ondansetron  (ZOFRAN -ODT) disintegrating tablet 4 mg (has no administration in time range)    Or  ondansetron  (ZOFRAN ) injection 4 mg (has no administration in time range)  albuterol  (PROVENTIL ) (2.5 MG/3ML) 0.083% nebulizer solution 2.5 mg (has no administration in time range)  HYDROmorphone  (DILAUDID ) injection 0.5 mg (has no administration in time range)  pantoprazole  (PROTONIX ) EC tablet 40 mg (has no administration in time range)  piperacillin -tazobactam (ZOSYN ) IVPB 3.375 g (has no administration in time range)  iohexol  (OMNIPAQUE ) 300 MG/ML solution 75 mL (75 mLs  Intravenous Contrast Given 01/21/24 1122)    ED Course/ Medical Decision Making/ A&P Clinical Course as of 01/21/24 1802  Thu Jan 21, 2024  1330 CT CHEST ABDOMEN PELVIS W CONTRAST [MK]    Clinical  Course User Index [MK] Kommor, Madison, MD                                 Medical Decision Making This patient presents to the ED for concern of lower extremity swelling, exertional dyspnea, jaundice, this involves an extensive number of treatment options, and is a complaint that carries with it a high risk of complications and morbidity.  The differential diagnosis includes malignancy, hepatitis, cirrhosis, heart failure, other   Co morbidities that complicate the patient evaluation :   History of alcohol  abuse x 30 years   Additional history obtained:  N/A   Lab Tests:  I Ordered, and personally interpreted labs.  The pertinent results include:   CBC-white blood cell count elevated at 17.1, hemoglobin 12.8 INR-slightly elevated at 1.3 BNP-126 CMP -sodium 123, chloride 88, CO2 is 14, BUN and creatinine elevated at 66 and 1.62 respectively, albumin low at 2.4, significantly elevated LFTs with AST 508, ALT 263, alk phos 1495, bilirubin is 20.2 and anion gap elevated at 21  Lactic acid elevated at 4.9 Urine sodium 18   Imaging Studies ordered:  I ordered imaging studies including x-ray which shows right medial lung base mass I independently visualized and interpreted imaging within scope of identifying emergent findings  I agree with the radiologist interpretation CT chest abdomen pelvis shows lung mass, extensive infiltrative liver metastases  Cardiac Monitoring: / EKG:  The patient was maintained on a cardiac monitor.  I personally viewed and interpreted the cardiac monitored which showed an underlying rhythm of: Sinus rhythm   Consultations Obtained:  I requested consultation with the hospitalist Dr. Bobbetta Burnet,  and discussed lab and imaging findings as well as pertinent plan -  they recommend: For metastatic disease, hyponatremia.   Problem List / ED Course / Critical interventions / Medication management  Jaundice-patient notes distant jaundice over the past 2 days, has never had this in the past but has had some abdominal distention and discomfort also having dyspnea on exertion for several months with a dry cough not improved with antibiotics x 2.  He has has some lower extremity edema.  Vitals are normal.  I have high suspicion of positive disease, chest abdomen pelvis ordered, formed by radiologist that patient has a large mass in the right lower lobe of his lung with extensive mediastinal and right hilar lymphadenopathy suggestive of primary lung neoplasm with metastatic disease.  He also had a very large and heterogeneous liver likely metastatic disease.  He notes that there is concern for hepatic infarction of the right lobe of his liver likely due to portal vein occlusion from compression from the metastases.  This is consistent with severe metastatic disease.  Given his lab findings with a bilirubin of 20.2, elevated lactic acid, hyponatremia, patient has very poor prognosis.  I had an extensive discussion with the patient and his family we discussed these findings.  Patient states he would not want chest compressions or intubation, he would want comfort measures and is wanting admission for his hyponatremia and hospice consult.  I did discuss that we could send him to [facility] to have bronchoscopy and/or liver biopsy but he states he would not want these things done.  I discussed with hospitalist who is agreeable with admission as above.  I have reviewed the patients home medicines and have made adjustments as needed    Amount and/or Complexity of Data Reviewed Labs: ordered. Radiology:  ordered. Decision-making details documented in ED Course.  Risk Prescription drug management. Decision regarding hospitalization.           Final Clinical  Impression(s) / ED Diagnoses Final diagnoses:  Liver failure without hepatic coma, unspecified chronicity (HCC)  Metastatic malignant neoplasm, unspecified site [facility])    Rx / DC Orders ED Discharge Orders     None         Aimee Houseman, PA-C 01/21/24 1802    Karlyn Overman, MD 01/22/24 [phone]

## 2024-01-21 NOTE — TOC Initial Note (Signed)
 Transition of Care [facility]) - Initial/Assessment Note    Patient Details  Name: Victor Bowen MRN: [id number] Date of Birth: 12/15/1955  [facility]) CM/SW Contact:    Geraldina Klinefelter, RN Phone Number: 01/21/2024, 6:38 PM  Clinical Narrative:                 Pt lives with and is PCG for his 67 y/o mother c/dementia. He does have supportive siblings who live nearby and are able to help. Per chart pt has been s/medical care for awhile and today was found to have several tumors c/metastasis. Palliative consult has been completed and pt has opted for comfort care. Pt states he would like [facility] "when the time comes". Instructed pt on hospice services and that it would be a good idea to go ahead and get established c/hospice now. Pt agreeable.   Expected Discharge Plan: Home w Hospice Care Barriers to Discharge: Continued Medical Work up   Patient Goals and CMS Choice Patient states their goals for this hospitalization and ongoing recovery are:: Comfort CMS Medicare.gov Compare Post Acute Care list provided to:: Patient Choice offered to / list presented to : Patient [place] ownership interest in [facility].provided to:: Patient    Expected Discharge Plan and Services In-house Referral: Clinical Social Work Discharge Planning Services: CM Consult Post Acute Care Choice: Hospice Living arrangements for the past 2 months: Single Family Home                 Prior Living Arrangements/Services Living arrangements for the past 2 months: Single Family Home Lives with:: Parents Patient language and need for interpreter reviewed:: Yes Do you feel safe going back to the place where you live?: Yes      Need for Family Participation in Patient Care: Yes (Comment) Care giver support system in place?: Yes (comment)   Criminal Activity/Legal Involvement Pertinent to Current Situation/Hospitalization: No - Comment as needed  Activities of Daily Living   ADL  Screening (condition at time of admission) Independently performs ADLs?: Yes (appropriate for developmental age) Is the patient deaf or have difficulty hearing?: No Does the patient have difficulty seeing, even when wearing glasses/contacts?: No Does the patient have difficulty concentrating, remembering, or making decisions?: No  Permission Sought/Granted Permission sought to share information with : Case Manager, Magazine features editor, Family Supports Permission granted to share information with : Yes, Verbal Permission Granted  Emotional Assessment   Attitude/Demeanor/Rapport: Engaged Affect (typically observed): Calm, Appropriate Orientation: : Oriented to Self, Oriented to Place, Oriented to  Time, Oriented to Situation   Psych Involvement: No (comment)  Admission diagnosis:  Metastatic disease (HCC) [C79.9] Liver failure without hepatic coma, unspecified chronicity (HCC) [K72.90] Metastatic malignant neoplasm, unspecified site [facility]) [C79.9] Patient Active Problem List   Diagnosis Date Noted   Metastatic disease (HCC) 01/21/2024   Metastatic lung cancer (metastasis from lung to other site) (HCC) 01/21/2024   Acute liver failure 01/21/2024   Leukocytosis 01/21/2024   Hyponatremia 01/21/2024   AKI (acute kidney injury) (HCC) 01/21/2024   Coagulopathy (HCC) 01/21/2024   Metabolic acidosis 01/21/2024   Elevated troponin 01/21/2024   Emphysema of lung (HCC) 01/21/2024   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 [place] [address] Phone: [phone] Fax: [phone]  Social Drivers of Health (SDOH) Social History: SDOH Screenings   Food Insecurity: No Food Insecurity (01/21/2024)  Housing: Low Risk  (01/21/2024)  Transportation Needs: No Transportation Needs (01/21/2024)  Utilities: Not At Risk (01/21/2024)  Social Connections: Moderately Integrated (01/21/2024)  Tobacco Use: Medium Risk (01/21/2024)   SDOH Interventions:  Readmission Risk Interventions    01/21/2024    6:19 PM  Readmission Risk Prevention Plan  Transportation Screening Complete  PCP or Specialist Appt within 5-7 Days Complete  Home Care Screening Complete  Medication Review (RN CM) Complete

## 2024-01-21 NOTE — H&P (Signed)
 History and Physical    Victor Bowen ZOX:[id number] DOB: 1955/10/15 DOA: 01/21/2024  PCP: Pcp, No   Patient coming from:Home Chief Complaint  Patient presents with   Shortness of Breath   HPI:68 year old male with significant history of smoking-30 pack years quit about 3 and half years ago and alcohol  use quit few months ago, no known past medical history but has not seen doctor in many years presenting for evaluation of several months of exertional dyspnea, cough abdominal bloating and yesterday noticed loose discoloration of skin and leg edema.  Patient went to urgent care twice and was given antibiotics without relief of symptoms and presented to the ED. Patient appears swollen and bloated appears uncomfortable, cachectic and pain.  Endorses weight loss. He has abdominal discomfort.Denies any nausea vomiting fever chills focal weakness. In the ED: Vitals-hemodynamically stable afebrile.Labs as below showing hyponatremia acute renal failure, transaminitis jaundice,hyponatremia : Na 123 potassium 5.1 bicarb 14 BUN 66 and creatinine 1.6 alk phos 1495 AST 508 ALT 263 total bili 20.2, CBC with leukocytosis 17K stable hb/platelet, VBG 7.3 pCO2 25. Patient appeared jaundiced with leg edema. 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 His MELD score around 31 per  EDP Patient and sister at bedside after hearing the findings-does not want to go through any biopsy or bronchoscopy and wants to be comfortable DNR.Hospitalist was consulted.   Assessment/Plan Principal Problem:   Acute liver failure Active Problems:   Metastatic disease (HCC)   Metastatic lung cancer (metastasis from lung to other site) (HCC)   Leukocytosis   Hyponatremia   AKI (acute kidney injury) (HCC)   Coagulopathy (HCC)   Metabolic acidosis   Elevated troponin   Emphysema of lung (HCC)   Hepatic infarction   Portal vein external compression   Mediastinal lymphadenopathy   Ascites   Pleural thickening   Pleural effusion   Protein-calorie malnutrition, severe (HCC)   Failure to thrive in adult   End of life care   Stopped smoking with greater than 30 pack year history  Very unfortunate presentation, finding concerning for metastatic lung cancer with hepatomegaly hepatic infarction transaminitis concerning for acute liver failure.  Patient has lost weight appears very uncomfortable, appears swollen unsteady with gait.  EDP discussed with the patient and he wants to pursue symptom relief treatment I also discussed the findings of imaging and labs with the patient and his sister at the bedside- discussed about option of biopsy tissue diagnosis-but patient at this time is not interested to pursue further workup and diagnosis, he will is requesting for DNR and wants to be comfortable, for this unfortunate presentation of likely advanced lung cancer. I brought up the idea of hospice palliative care consulted [facility] consulted Will discuss with GI if there is any symptom relief we can provide Added  prn IV Dilaudid , oral Ativan  antiemetics for symptom management for anxiety. Allow diet regular.  Body mass index is 25.46 kg/m.   Severity of Illness: The appropriate patient  status for this patient is INPATIENT. Inpatient status is judged to be reasonable and necessary in order to  provide the required intensity of service to ensure the patient's safety. The patient's presenting symptoms, physical exam findings, and initial radiographic and laboratory data in the context of their chronic comorbidities is felt to place them at high risk for further clinical deterioration. Furthermore, it is not anticipated that the patient will be medically stable for discharge from the hospital within 2 midnights of admission.   * I certify that at the point of admission it is my clinical judgment that the patient will require inpatient hospital care spanning beyond 2 midnights from the point of admission due to high intensity of service, high risk for further deterioration and high frequency of surveillance required.*   DVT prophylaxis:   none for comfort Code Status:   Code Status: Do not attempt resuscitation (DNR) - Comfort care  Family Communication: Admission, patients condition and plan of care including tests being ordered have been discussed with the patient and sister who indicate understanding and agree with the plan and Code Status.  Consults called:  GI  Review of Systems: All systems were reviewed and were negative except as mentioned in HPI above. Negative for fever Negative for chest pain Negative for  focal weakness  History reviewed. No pertinent past medical history.  History reviewed. No pertinent surgical history.   reports that he has quit smoking. His smoking use included cigarettes. He does not have any smokeless tobacco history on file. No history on file for alcohol  use and drug use.  Not on File  History reviewed. No pertinent family history.   Prior to Admission medications   Not on File    Physical Exam: Vitals:   01/21/24 1200 01/21/24 1245 01/21/24 1407 01/21/24 1409  BP: (!) 148/83 (!) 146/85    Pulse: 82 86    Resp: (!) 23 20    Temp:   98.3 F (36.8 C) 98.5 F (36.9 C)  TempSrc:   Rectal   SpO2: 96% 91%    Weight:      Height:         General exam: AAOx3, frail ill appearing HEENT:Oral mucosa moist, Ear/Nose WNL grossly, dentition caries Respiratory system: bilaterally clear,no wheezing or crackles,no use of accessory muscle Cardiovascular system: S1 & S2 +,No JVD,. Gastrointestinal system: Abdomen soft, distended tender centrally, BS+ Nervous System:Alert, awake, moving extremities and grossly nonfocal Extremities: edema ++, distal peripheral pulses palpable.  Skin: No rashes,no icterus. MSK: Normal muscle bulk,tone, power   Labs on Admission: I have personally reviewed following labs and imaging studies  CBC: Recent Labs  Lab 01/21/24 1005  WBC 17.1*  NEUTROABS 13.4*  HGB 12.8*  HCT 37.8*  MCV 88.1  PLT 295   Basic Metabolic Panel: Recent Labs  Lab 01/21/24 1005  NA 123*  K 5.1  CL 88*  CO2 14*  GLUCOSE 92  BUN 66*  CREATININE 1.62*  CALCIUM 9.3   Estimated Creatinine Clearance: 46.4 mL/min (A) (by C-G formula based on SCr of 1.62 mg/dL (H)). Recent Labs  Lab 01/21/24 1005  AST 508*  ALT 263*  ALKPHOS 1,495*  BILITOT 20.2*  PROT 6.2*  ALBUMIN 2.4*  Coagulation Profile: Recent Labs  Lab 01/21/24 1005  INR 1.3*  Cardiac Panel (last 3 results) Recent Labs    01/21/24 1005 01/21/24 1315  TROPONINIHS 20* 18*   Radiological Exams on Admission: CT CHEST ABDOMEN PELVIS W CONTRAST Result Date: 01/21/2024 CLINICAL  DATA:  Shortness of breath. Concern for a mass at the right lung base on recent chest radiograph. Metastatic disease evaluation. EXAM: CT CHEST, ABDOMEN, AND PELVIS WITH CONTRAST TECHNIQUE: Multidetector CT imaging of the chest, abdomen and pelvis was performed following the standard protocol during bolus administration of intravenous contrast. RADIATION DOSE REDUCTION: This exam was performed according to the departmental dose-optimization program which includes automated exposure control, adjustment of the mA and/or kV according to patient size and/or use of iterative  reconstruction technique. CONTRAST:  75mL OMNIPAQUE  IOHEXOL  300 MG/ML  SOLN COMPARISON:  Chest radiograph 01/21/2024.  CT chest 05/13/2024 FINDINGS: CT CHEST FINDINGS Cardiovascular: Atherosclerotic calcifications in thoracic aorta without aneurysm. Coronary artery calcifications. Heart size is normal. No significant pericardial effusion. Mediastinum/Nodes: Enlarged mediastinal and right hilar lymphadenopathy. Right hilar nodal tissue measures 2.4 cm in the short axis on image 30/2. Enlarged subcarinal tissue measures 2.3 cm in the short axis. Pretracheal soft tissue measures 2.3 cm in the short axis on image 20/2. Mildly prominent right supraclavicular lymph nodes. No axillary lymph node enlargement. Enlarged lymph nodes in the right cardiophrenic region on image 46/2. Lungs/Pleura: Large irregular mass in the right lower lobe measures 7.5 x 5.8 x 5.0 cm. Focal fluid or low-density along the periphery of the mass adjacent to a small right pleural effusion. Centrilobular emphysema. Parenchymal architecture distortion at the right lung apex is likely chronic based on the exam from 2005. Concern for pleural-based nodular densities along the right hemidiaphragm on image 119/4, image 114, image 113 and image 112. Probable adherent mucus along the right lower aspect of the trachea on image 49/4. Right lower lobe airways extend directly into the large right lower lobe mass. Tiny focus of pleural nodularity in the right upper lobe on image 79/4 and there is focal pleural thickening adjacent to the right sixth rib on image 85/4. Musculoskeletal: Pleural thickening adjacent to the lateral right sixth rib. Question a small focus of sclerosis in the right sixth rib. Low-density superficial nodular structure in the left posterior back on image 23/2 measures up to 3.2 cm and suspect this is a sebaceous cyst. CT ABDOMEN PELVIS FINDINGS Hepatobiliary: Liver is enlarged and very heterogeneous. Liver measures 26.9 cm in craniocaudal  dimension. Small amount of perihepatic fluid. Large geographic areas of low-density in the right hepatic lobe and involving segment 4. These areas of low-density persist on the delayed images along the periphery and raise concern for areas of infarct. Heterogeneity in the left hepatic lobe is concerning for an infiltrative process and suspect diffuse metastatic disease. Main portal vein and left portal vein are patent. No significant flow in the right portal vein. 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 Gallbladder is decompressed with stones. Small amount of fluid or thickening around the gallbladder but poorly characterized. Pancreas: Unremarkable. No pancreatic ductal dilatation or surrounding inflammatory changes. Spleen: Spleen is for normal for size. No gross abnormality to the spleen. Trace fluid around the spleen. Adrenals/Urinary Tract: Normal adrenal glands. Normal appearance of both kidneys without hydronephrosis. No suspicious renal lesion. Exophytic low-density cyst in left kidney lower pole does not require dedicated follow-up. Mild bladder wall thickening is nonspecific. Stomach/Bowel: Normal appearance of the stomach. No significant bowel dilatation. No evidence for bowel obstruction. Vascular/Lymphatic: Atherosclerotic calcifications in the abdominal aorta without aneurysm. No significant lymph node enlargement in the abdomen or pelvis. Concern for right portal vein occlusion as described in the hepatobiliary section. Prominent lymph nodes in the periportal and precaval region on image 68/2  and image 73/2. Reproductive: Prostate is prominent with calcifications. Other: Small amount of free fluid in the pelvis. Small amount of fluid around the liver and spleen. No evidence for free air. Musculoskeletal: No acute bone abnormality. IMPRESSION: 1. Large mass in the right lower lobe measuring up to 7.5 cm. Extensive mediastinal and right hilar lymphadenopathy. There is also right chest pleural  nodularity. Findings are suggestive for a primary lung neoplasm with metastatic disease. 2. Liver is enlarged and very heterogeneous. Suspect these findings are related to diffuse metastatic disease within the liver. The right hepatic lobe is significantly enlarged and has areas of persistent low density along the periphery on the delayed images. These findings are concerning for areas of hepatic infarction secondary to right portal vein occlusion. Right portal vein occlusion may be secondary to external compression rather than tumor thrombus. 3. Enlarged lymph nodes in the upper abdomen. Question slightly enlarged right supraclavicular lymph nodes. 4. Pleural thickening adjacent to the right sixth rib. Favor pleural disease rather than a bone lesion. 5. Trace right pleural fluid. 6. Small amount of ascites. 7.  Emphysema (ICD10-J43.9). 8.  Aortic Atherosclerosis (ICD10-I70.0). These results were called by telephone at the time of interpretation on 01/21/2024 at 2:01 pm to provider CELESTE BEATTY , who verbally acknowledged these results. Electronically Signed   By: Elene Griffes M.D.   On: 01/21/2024 14:07   DG Chest Portable 1 View Result Date: 01/21/2024 CLINICAL DATA:  dyspnea EXAM: PORTABLE CHEST - 1 VIEW COMPARISON:  None available. FINDINGS: Small right pleural effusion with right basilar airspace opacities, likely atelectasis. No pneumothorax. Masslike opacity in the medial right lung base measuring 5.5 cm. 1 cm nodular opacity in the right upper lung zone. Thickening of the right paratracheal stripe. No cardiomegaly. No acute fracture or destructive lesion. IMPRESSION: 1. Rounded masslike opacity in the medial right lung base measuring 5.5 cm. A follow-up chest CT is recommended to evaluate for possible neoplasm. 2. Thickening of the right paratracheal stripe, which can be seen in mediastinal lymphadenopathy. 1 cm nodule in the right lung apex. These findings should also be further evaluated on the  aforementioned chest CT. 3. Small right pleural effusion with right basilar airspace opacities, likely atelectasis. Electronically Signed   By: Rance Burrows M.D.   On: 01/21/2024 10:59   Lesa Rape MD [facility]  If 7PM-7AM, please contact night-coverage [email or web address]  01/21/2024, 3:23 PM

## 2024-01-21 NOTE — Discharge Instructions (Signed)

## 2024-01-22 DIAGNOSIS — C3491 Malignant neoplasm of unspecified part of right bronchus or lung: Secondary | ICD-10-CM

## 2024-01-22 DIAGNOSIS — E872 Acidosis, unspecified: Secondary | ICD-10-CM | POA: Insufficient documentation

## 2024-01-22 MED ORDER — HYDROMORPHONE HCL 2 MG PO TABS: 1.0000 mg | ORAL_TABLET | ORAL | Status: DC | PRN

## 2024-01-22 NOTE — Plan of Care (Signed)

## 2024-01-22 NOTE — Progress Notes (Signed)
 Pts family is asking for a Child psychotherapist to reach out them tomorrow

## 2024-01-22 NOTE — Progress Notes (Signed)
 PROGRESS NOTE Victor Bowen  NAT:[id number] DOB: 08-19-1956 DOA: 01/21/2024 PCP: Pcp, No  Brief Narrative/Hospital Course: 68 year old male with significant history of smoking-30 pack years quit about 3 and half years ago and alcohol  use quit few months ago, no known past medical history but has not seen doctor in many years presenting for evaluation of several months of exertional dyspnea, cough abdominal bloating and yesterday noticed loose discoloration of skin and leg edema.  Patient went to urgent care twice and was given antibiotics without relief of symptoms and presented to the ED. Patient appears swollen and bloated appears uncomfortable, cachectic and pain.  Endorses weight loss. He has abdominal discomfort.Denies any nausea vomiting fever chills focal weakness. In the ED: Vitals-hemodynamically stable afebrile.Labs as below showing hyponatremia acute renal failure, transaminitis jaundice,hyponatremia : Na 123 potassium 5.1 bicarb 14 BUN 66 and creatinine 1.6 alk phos 1495 AST 508 ALT 263 total bili 20.2, CBC with leukocytosis 17K stable hb/platelet, VBG 7.3 pCO2 25. Patient appeared jaundiced with leg edema. 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 His MELD score around 31 per EDP Patient and sister at bedside after hearing the findings-does not want to go  through any biopsy or bronchoscopy and wants to be comfortable DNR.Hospitalist was consulted. GI Dr. Andy Keen was consulted> MRCP obtained to see if any role of stenting of bile duct-empiric Zosyn  was initiated pending MRI. Palliative care was consulted as well. 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  Portal lymphadenopathy noticed> discussed with GI no role of further testing or procedure at this time and advised hospice. Patient and family agreed for hospice [facility] consulted for hospice referral after extensive palliative care evaluation   Subjective: Seen and examined 2 sisters at the bedside appears comfortable He slept good after getting Ativan  last night taking pain medication Appears weak frail  Assessment and plan: Principal Problem:   Metastatic lung cancer (metastasis from lung to other site) [facility]) Active Problems:   Metastatic disease (HCC)   Acute liver failure   Leukocytosis   Hyponatremia   AKI (acute kidney injury) (HCC)   Coagulopathy (HCC)   Metabolic acidosis   Elevated troponin   Emphysema of lung (HCC)   Hepatic infarction   Portal vein external compression   Mediastinal lymphadenopathy   Ascites   Pleural thickening   Pleural effusion   Protein-calorie malnutrition, severe (HCC)   Failure to thrive in adult   End of life care   Stopped smoking with greater than 30 pack year history   Lactic acidosis   Patient and family does not want to pursue further workup in terms of biopsy. From imaging finding and presentation and from clinical picture  consistent with metastatic lung cancer advanced stage with right hepatic  lobe failure with tumor and innumerable lesions in the left hepatic lobe, with acute liver dysfunctions concerning for hepatic failure.  MRCP no actionable finding. Plan is to continue on end-of-life care comfort measures with PRN's anxiolytics, opiates, encourage p.o. intake  Antibiotic discontinued  TOC consulted to arrange hospice in next 1 to 2 days  Patient sisters plan to take him ot their home and arrange hospice if able  DVT prophylaxis:  Code Status:   Code Status: Do not attempt resuscitation (DNR) - Comfort care Family Communication: plan of care discussed with patient/2 sisters at bedside. Patient status is: Remains hospitalized because of severity of illness Level of care: Med-Surg   Dispo: The patient is from: Home, has mother with dementia            Anticipated disposition: TBD-likely 2 sisters are with home hospice Objective: Vitals last 24 hrs: Vitals:   01/21/24 1409 01/21/24 1813 01/21/24 2012 01/22/24 0407  BP:  (!) 142/78 139/70 121/70  Pulse:  88 81 78  Resp:  20 20 16   Temp: 98.5 F (36.9 C) 98 F (36.7 C) 97.6 F (36.4 C) 97.8 F (36.6 C)  TempSrc:  Oral Oral Oral  SpO2:  96% 96% 94%  Weight:  76.4 kg    Height:  5\' 10"  (1.778 m)      Physical Examination: General exam: alert awake, ill looking frail older than stated age HEENT:Oral mucosa moist, Ear/Nose WNL grossly Respiratory system: B.L clear BS, no use of accessory muscle Cardiovascular system: S1 & S2 +. Gastrointestinal system: Abdomen soft, distended mild diffuse tenderness Nervous System: Alert, awake, he is following commands. Extremities: LE edema +, warm extremities Skin: No rashes,warm.  Jaundice present MSK: Normal muscle bulk/tone.   Data Reviewed: I have personally reviewed following labs and imaging studies ( see epic result tab) CBC: Recent Labs  Lab 01/21/24 1005  WBC 17.1*  NEUTROABS 13.4*  HGB 12.8*  HCT 37.8*  MCV 88.1  PLT 295   CMP: Recent Labs  Lab  01/21/24 1005  NA 123*  K 5.1  CL 88*  CO2 14*  GLUCOSE 92  BUN 66*  CREATININE 1.62*  CALCIUM 9.3   GFR: Estimated Creatinine Clearance: 45.7 mL/min (A) (by C-G formula based on SCr of 1.62 mg/dL (H)). Recent Labs  Lab 01/21/24 1005  AST 508*  ALT 263*  ALKPHOS 1,495*  BILITOT 20.2*  PROT 6.2*  ALBUMIN 2.4*   No results for input(s): "LIPASE", "AMYLASE" in the last 168 hours. No results for input(s): "AMMONIA" in the last 168 hours. Coagulation Profile:  Recent Labs  Lab 01/21/24 1005  INR 1.3*   Unresulted Labs (From admission, onward)    None      Antimicrobials/Microbiology: Anti-infectives (From admission, onward)    Start     Dose/Rate Route Frequency Ordered Stop   01/21/24 1700  piperacillin -tazobactam (ZOSYN ) IVPB 3.375 g  Status:  Discontinued        3.375 g 12.5 mL/hr over 240 Minutes Intravenous Every 8 hours 01/21/24 1628 01/22/24 0850      No results found for: "SDES", "SPECREQUEST", "CULT", "REPTSTATUS"  Procedures:  Medications reviewed:  Scheduled Meds:  pantoprazole   40 mg Oral Daily   Continuous Infusions:  Lesa Rape, MD [facility] 01/22/2024, 11:28 AM

## 2024-01-23 DIAGNOSIS — K7290 Hepatic failure, unspecified without coma: Secondary | ICD-10-CM

## 2024-01-23 DIAGNOSIS — C799 Secondary malignant neoplasm of unspecified site: Secondary | ICD-10-CM | POA: Diagnosis not present

## 2024-01-23 DIAGNOSIS — C3490 Malignant neoplasm of unspecified part of unspecified bronchus or lung: Secondary | ICD-10-CM | POA: Diagnosis not present

## 2024-01-23 MED ORDER — OXYCODONE HCL 10 MG PO TABS
10.0000 mg | ORAL_TABLET | ORAL | 0 refills | Status: AC | PRN
Start: 2024-01-23 — End: ?

## 2024-01-23 MED ORDER — LORAZEPAM 0.5 MG PO TABS: 0.5000 mg | ORAL_TABLET | Freq: Four times a day (QID) | ORAL | 0 refills | Status: AC | PRN

## 2024-01-23 MED ORDER — OXYCODONE HCL 5 MG PO TABS
10.0000 mg | ORAL_TABLET | ORAL | Status: DC | PRN
  Administered 2024-01-23: 10 mg via ORAL
  Filled 2024-01-23: qty 2

## 2024-01-23 NOTE — Plan of Care (Signed)
  Problem: Education: Goal: Knowledge of General Education information will improve Description: Including pain rating scale, medication(s)/side effects and non-pharmacologic comfort measures Outcome: Progressing   Problem: Clinical Measurements: Goal: Ability to maintain clinical measurements within normal limits will improve Outcome: Progressing Goal: Will remain free from infection Outcome: Progressing Goal: Diagnostic test results will improve Outcome: Progressing Goal: Respiratory complications will improve Outcome: Progressing Goal: Cardiovascular complication will be avoided Outcome: Progressing   Problem: Coping: Goal: Level of anxiety will decrease Outcome: Progressing   Problem: Elimination: Goal: Will not experience complications related to bowel motility Outcome: Progressing Goal: Will not experience complications related to urinary retention Outcome: Progressing   Problem: Pain Managment: Goal: General experience of comfort will improve and/or be controlled Outcome: Progressing   Problem: Safety: Goal: Ability to remain free from injury will improve Outcome: Progressing

## 2024-01-23 NOTE — Discharge Summary (Signed)
 Physician Discharge Summary   Patient: Victor Bowen MRN: [id number] DOB: 20-Mar-1956  Admit date:     01/21/2024  Discharge date: 01/23/24  Discharge Physician: Myrtie Atkinson Ravin Bendall   PCP: Pcp, No   Recommendations at discharge:  Discharge home with hospice care ([facility] to follow after d/c)    Hospital Course: 68 year old male with significant history of smoking-30 pack years quit about 3 and half years ago and alcohol  use quit few months ago, no known past medical history but has not seen doctor in many years presenting for evaluation of several months of exertional dyspnea, cough abdominal bloating and yesterday noticed loose discoloration of skin and leg edema.  Patient went to urgent care twice and was given antibiotics without relief of symptoms and presented to the ED. Patient appears swollen and bloated appears uncomfortable, cachectic and pain.  Endorses weight loss. He has abdominal discomfort.Denies any nausea vomiting fever chills focal weakness. In the ED: Vitals-hemodynamically stable afebrile.Labs as below showing hyponatremia acute renal failure, transaminitis jaundice,hyponatremia : Na 123 potassium 5.1 bicarb 14 BUN 66 and creatinine 1.6 alk phos 1495 AST 508 ALT 263 total bili 20.2, CBC with leukocytosis 17K stable hb/platelet, VBG 7.3 pCO2 25. Patient appeared jaundiced with leg edema. CXR>abnormal 5.5 cm medial right lung base opacity, thickening of the right paratracheal stripe 1 cm nodule right lung apex,CT chest abdomen pelvis w/ contrast> RLL mass 7.5 cm, extensive mediastinal, right hilar lymphadenopathy, right chest pleural nodularity suggestive of primary lung neoplasm with metastatic disease, enlarged liver with concern for diffuse metastatic disease, finding concerning for area of hepatic infarction secondary to right portal vein occlusion, right portal vein compression likely secondary to external compression rather than tumor thrombus.Enlarged lymph nodes in the upper abdomen  pleural thickening adjacent to the right sixth rib, trace right pleural fluid. His MELD score around 31 per EDP Patient and sister at bedside after hearing the findings-does not want to go through any biopsy or bronchoscopy and wants to be comfortable DNR.Hospitalist was consulted. GI Dr. Andy Keen was consulted> MRCP obtained to see if any role of stenting of bile duct-empiric Zosyn  was initiated pending MRI. Palliative care was consulted as well. 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  Portal lymphadenopathy noticed> discussed with GI no role of further testing or procedure at this time and advised hospice. Patient and family agreed for hospice [facility] consulted for hospice referral after extensive palliative care evaluation  On 01/23/2024, the patient remained medically stable for discharge home.  Goals of care were reaffirmed with the patient and family.  The plan is for comfort care with discharge home to the sisters place of residence.  Ultimate goal is for transition to residential hospice once deemed appropriate by [facility] hospice care.  TOC assisted in the transition and arranging for hospice.  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 - Goals of care discussed with the patient and family - They did not wish to pursue further invasive procedures for diagnostic or therapeutic modalities - CT abdomen and pelvis and MRCP as discussed above  Hepatic failure, chronicity undetermined - Presented with AST 508, ALT 236, alkaline phosphatase 1495, total bilirubin 20.2 - Secondary  to metastatic cancer and  portal venous thrombosis - Patient and family wish to pursue comfort measures  Portal venous thrombosis - Patient and family wish to pursue comfort measures  Hyponatremia - Secondary to poor solute intake with a component of SIADH  Renal failure, NOS - No previous baseline to compare  Lactic acidosis - Presented with lactic acid 4.9 - Patient and family wish to pursue comfort measures  Elevated troponin - Secondary to demand ischemia - No chest pain complaints      Consultants: palliative, GI Procedures performed: none  Disposition: Home Diet recommendation:  Regular diet DISCHARGE MEDICATION: Allergies as of 01/23/2024   No Known Allergies      Medication List     STOP taking these medications    doxycycline 100 MG capsule Commonly known as: MONODOX   methylPREDNISolone 4 MG Tbpk tablet Commonly known as: MEDROL DOSEPAK       TAKE these medications    acetaminophen  500 MG tablet Commonly known as: TYLENOL  Take 1,000 mg by mouth every 6 (six) hours as needed for mild pain (pain score 1-3).   albuterol  108 (90 Base) MCG/ACT inhaler Commonly known as: VENTOLIN  HFA Inhale 2 puffs into the lungs every 6 (six) hours as needed for wheezing or shortness of breath.   LORazepam  0.5 MG tablet Commonly known as: ATIVAN  Take 1 tablet (0.5 mg total) by mouth every 6 (six) hours as needed for anxiety.   Oxycodone HCl 10 MG Tabs Take 1 tablet (10 mg total) by mouth every 4 (four) hours as needed for moderate pain (pain score 4-6).        Discharge Exam: Filed Weights   01/21/24 1001 01/21/24 1813  Weight: 81.6 kg 76.4 kg   HEENT:  [place]/AT, No thrush, no icterus CV:  RRR, no rub, no S3, no S4 Lung:  diminished BS,  bilateral rales. No wheeze Abd:  soft/+BS, upper abd tender.  Mildly distended Ext:  2 + LE edema, no lymphangitis, no synovitis, no rash   Condition at discharge: stable  The results of significant diagnostics from this hospitalization  (including imaging, microbiology, ancillary and laboratory) are listed below for reference.   Imaging Studies: MR ABDOMEN WITH MRCP W CONTRAST Result Date: 01/21/2024 CLINICAL DATA:  Elevated liver function studies and abnormal liver on recent MRI. Patient has a large right lower lobe mass and extensive mediastinal and hilar adenopathy. EXAM: MRI ABDOMEN WITH CONTRAST (WITH MRCP) TECHNIQUE: Multiplanar multisequence MR imaging of the abdomen was performed following the administration of intravenous contrast. Heavily T2-weighted images of the biliary and pancreatic ducts were obtained, and three-dimensional MRCP images were rendered by post processing. CONTRAST:  8mL GADAVIST  GADOBUTROL  1 MMOL/ML IV SOLN COMPARISON:  CT scan 01/21/2024 FINDINGS: Examination is signal hit by respiratory motion. Lower chest: As demonstrated on the CT scan there is a large right lower lobe lung mass and mediastinal and hilar adenopathy likely primary lung neoplasm. Small right pleural effusion. Hepatobiliary: The liver is diffusely infiltrated with tumor. The entire right hepatic lobe is filled with tumor and there are innumerable lesions in the left hepatic lobe. Marked diffusion positivity. There are numerous deep cortical defects in the liver mainly in the right hepatic lobe likely infarctions due to middle and portal vein occlusion. The left portal vein is patent. The main portal vein is patent. No biliary dilatation. The gallbladder is markedly contracted and contains small gallstones. No common bile duct dilatation. Pancreas:  No mass, inflammation or ductal dilatation. Spleen:  Normal  size.  No focal lesions. Adrenals/Urinary Tract: No definite adrenal gland lesions but study limited. No renal lesions. Stomach/Bowel: Visualized portions within the abdomen grossly normal. Vascular/Lymphatic: Periportal lymphadenopathy. The aorta and branch vessels are patent. Other: Small amount of perihepatic fluid. No obvious omental disease.  Musculoskeletal: No metastatic bone lesions are identified. IMPRESSION: 1. Large right lower lobe lung mass and mediastinal and hilar adenopathy likely primary lung neoplasm. 2. The liver is diffusely infiltrated with tumor. The entire right hepatic lobe is filled with tumor and there are innumerable lesions in the left hepatic lobe. Marked diffusion positivity. Findings are consistent with extensive hepatic metastatic disease. 3. Numerous deep cortical defects in the liver mainly in the right hepatic lobe likely infarctions due to middle and portal vein occlusion. The left portal vein is patent. 4. Periportal lymphadenopathy. 5. Small amount of perihepatic fluid. 6. No metastatic bone lesions are identified. Electronically Signed   By: Marrian Siva M.D.   On: 01/21/2024 19:15   MR 3D Recon At Scanner Result Date: 01/21/2024 CLINICAL DATA:  Elevated liver function studies and abnormal liver on recent MRI. Patient has a large right lower lobe mass and extensive mediastinal and hilar adenopathy. EXAM: MRI ABDOMEN WITH CONTRAST (WITH MRCP) TECHNIQUE: Multiplanar multisequence MR imaging of the abdomen was performed following the administration of intravenous contrast. Heavily T2-weighted images of the biliary and pancreatic ducts were obtained, and three-dimensional MRCP images were rendered by post processing. CONTRAST:  8mL GADAVIST  GADOBUTROL  1 MMOL/ML IV SOLN COMPARISON:  CT scan 01/21/2024 FINDINGS: Examination is signal hit by respiratory motion. Lower chest: As demonstrated on the CT scan there is a large right lower lobe lung mass and mediastinal and hilar adenopathy likely primary lung neoplasm. Small right pleural effusion. Hepatobiliary: The liver is diffusely infiltrated with tumor. The entire right hepatic lobe is filled with tumor and there are innumerable lesions in the left hepatic lobe. Marked diffusion positivity. There are numerous deep cortical defects in the liver mainly in the right hepatic  lobe likely infarctions due to middle and portal vein occlusion. The left portal vein is patent. The main portal vein is patent. No biliary dilatation. The gallbladder is markedly contracted and contains small gallstones. No common bile duct dilatation. Pancreas:  No mass, inflammation or ductal dilatation. Spleen:  Normal size.  No focal lesions. Adrenals/Urinary Tract: No definite adrenal gland lesions but study limited. No renal lesions. Stomach/Bowel: Visualized portions within the abdomen grossly normal. Vascular/Lymphatic: Periportal lymphadenopathy. The aorta and branch vessels are patent. Other: Small amount of perihepatic fluid. No obvious omental disease. Musculoskeletal: No metastatic bone lesions are identified. IMPRESSION: 1. Large right lower lobe lung mass and mediastinal and hilar adenopathy likely primary lung neoplasm. 2. The liver is diffusely infiltrated with tumor. The entire right hepatic lobe is filled with tumor and there are innumerable lesions in the left hepatic lobe. Marked diffusion positivity. Findings are consistent with extensive hepatic metastatic disease. 3. Numerous deep cortical defects in the liver mainly in the right hepatic lobe likely infarctions due to middle and portal vein occlusion. The left portal vein is patent. 4. Periportal lymphadenopathy. 5. Small amount of perihepatic fluid. 6. No metastatic bone lesions are identified. Electronically Signed   By: Marrian Siva M.D.   On: 01/21/2024 19:15   CT CHEST ABDOMEN PELVIS W CONTRAST Result Date: 01/21/2024 CLINICAL DATA:  Shortness of breath. Concern for a mass at the right lung base on recent chest radiograph. Metastatic disease evaluation. EXAM: CT CHEST, ABDOMEN, AND  PELVIS WITH CONTRAST TECHNIQUE: Multidetector CT imaging of the chest, abdomen and pelvis was performed following the standard protocol during bolus administration of intravenous contrast. RADIATION DOSE REDUCTION: This exam was performed according to the  departmental dose-optimization program which includes automated exposure control, adjustment of the mA and/or kV according to patient size and/or use of iterative reconstruction technique. CONTRAST:  75mL OMNIPAQUE  IOHEXOL  300 MG/ML  SOLN COMPARISON:  Chest radiograph 01/21/2024.  CT chest 05/13/2024 FINDINGS: CT CHEST FINDINGS Cardiovascular: Atherosclerotic calcifications in thoracic aorta without aneurysm. Coronary artery calcifications. Heart size is normal. No significant pericardial effusion. Mediastinum/Nodes: Enlarged mediastinal and right hilar lymphadenopathy. Right hilar nodal tissue measures 2.4 cm in the short axis on image 30/2. Enlarged subcarinal tissue measures 2.3 cm in the short axis. Pretracheal soft tissue measures 2.3 cm in the short axis on image 20/2. Mildly prominent right supraclavicular lymph nodes. No axillary lymph node enlargement. Enlarged lymph nodes in the right cardiophrenic region on image 46/2. Lungs/Pleura: Large irregular mass in the right lower lobe measures 7.5 x 5.8 x 5.0 cm. Focal fluid or low-density along the periphery of the mass adjacent to a small right pleural effusion. Centrilobular emphysema. Parenchymal architecture distortion at the right lung apex is likely chronic based on the exam from 2005. Concern for pleural-based nodular densities along the right hemidiaphragm on image 119/4, image 114, image 113 and image 112. Probable adherent mucus along the right lower aspect of the trachea on image 49/4. Right lower lobe airways extend directly into the large right lower lobe mass. Tiny focus of pleural nodularity in the right upper lobe on image 79/4 and there is focal pleural thickening adjacent to the right sixth rib on image 85/4. Musculoskeletal: Pleural thickening adjacent to the lateral right sixth rib. Question a small focus of sclerosis in the right sixth rib. Low-density superficial nodular structure in the left posterior back on image 23/2 measures up to 3.2  cm and suspect this is a sebaceous cyst. CT ABDOMEN PELVIS FINDINGS Hepatobiliary: Liver is enlarged and very heterogeneous. Liver measures 26.9 cm in craniocaudal dimension. Small amount of perihepatic fluid. Large geographic areas of low-density in the right hepatic lobe and involving segment 4. These areas of low-density persist on the delayed images along the periphery and raise concern for areas of infarct. Heterogeneity in the left hepatic lobe is concerning for an infiltrative process and suspect diffuse metastatic disease. Main portal vein and left portal vein are patent. No significant flow in the right portal vein. 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 Gallbladder is decompressed with stones. Small amount of fluid or thickening around the gallbladder but poorly characterized. Pancreas: Unremarkable. No pancreatic ductal dilatation or surrounding inflammatory changes. Spleen: Spleen is for normal for size. No gross abnormality to the spleen. Trace fluid around the spleen. Adrenals/Urinary Tract: Normal adrenal glands. Normal appearance of both kidneys without hydronephrosis. No suspicious renal lesion. Exophytic low-density cyst in left kidney lower pole does not require dedicated follow-up. Mild bladder wall thickening is nonspecific. Stomach/Bowel: Normal appearance of the stomach. No significant bowel dilatation. No evidence for bowel obstruction. Vascular/Lymphatic: Atherosclerotic calcifications in the abdominal aorta without aneurysm. No significant lymph node enlargement in the abdomen or pelvis. Concern for right portal vein occlusion as described in the hepatobiliary section. Prominent lymph nodes in the periportal and precaval region on image 68/2 and image 73/2. Reproductive: Prostate is prominent with calcifications. Other: Small amount of free fluid in the pelvis. Small amount of fluid around the liver and  spleen. No evidence for free air. Musculoskeletal: No acute bone abnormality.  IMPRESSION: 1. Large mass in the right lower lobe measuring up to 7.5 cm. Extensive mediastinal and right hilar lymphadenopathy. There is also right chest pleural nodularity. Findings are suggestive for a primary lung neoplasm with metastatic disease. 2. Liver is enlarged and very heterogeneous. Suspect these findings are related to diffuse metastatic disease within the liver. The right hepatic lobe is significantly enlarged and has areas of persistent low density along the periphery on the delayed images. These findings are concerning for areas of hepatic infarction secondary to right portal vein occlusion. Right portal vein occlusion may be secondary to external compression rather than tumor thrombus. 3. Enlarged lymph nodes in the upper abdomen. Question slightly enlarged right supraclavicular lymph nodes. 4. Pleural thickening adjacent to the right sixth rib. Favor pleural disease rather than a bone lesion. 5. Trace right pleural fluid. 6. Small amount of ascites. 7.  Emphysema (ICD10-J43.9). 8.  Aortic Atherosclerosis (ICD10-I70.0). These results were called by telephone at the time of interpretation on 01/21/2024 at 2:01 pm to provider CELESTE BEATTY , who verbally acknowledged these results. Electronically Signed   By: Elene Griffes M.D.   On: 01/21/2024 14:07   DG Chest Portable 1 View Result Date: 01/21/2024 CLINICAL DATA:  dyspnea EXAM: PORTABLE CHEST - 1 VIEW COMPARISON:  None available. FINDINGS: Small right pleural effusion with right basilar airspace opacities, likely atelectasis. No pneumothorax. Masslike opacity in the medial right lung base measuring 5.5 cm. 1 cm nodular opacity in the right upper lung zone. Thickening of the right paratracheal stripe. No cardiomegaly. No acute fracture or destructive lesion. IMPRESSION: 1. Rounded masslike opacity in the medial right lung base measuring 5.5 cm. A follow-up chest CT is recommended to evaluate for possible neoplasm. 2. Thickening of the right  paratracheal stripe, which can be seen in mediastinal lymphadenopathy. 1 cm nodule in the right lung apex. These findings should also be further evaluated on the aforementioned chest CT. 3. Small right pleural effusion with right basilar airspace opacities, likely atelectasis. Electronically Signed   By: Rance Burrows M.D.   On: 01/21/2024 10:59    Microbiology: No results found for this or any previous visit.  Labs: CBC: Recent Labs  Lab 01/21/24 1005  WBC 17.1*  NEUTROABS 13.4*  HGB 12.8*  HCT 37.8*  MCV 88.1  PLT 295   Basic Metabolic Panel: Recent Labs  Lab 01/21/24 1005  NA 123*  K 5.1  CL 88*  CO2 14*  GLUCOSE 92  BUN 66*  CREATININE 1.62*  CALCIUM 9.3   Liver Function Tests: Recent Labs  Lab 01/21/24 1005  AST 508*  ALT 263*  ALKPHOS 1,495*  BILITOT 20.2*  PROT 6.2*  ALBUMIN 2.4*   CBG: No results for input(s): "GLUCAP" in the last 168 hours.  Discharge time spent: greater than 30 minutes.  Signed: Demaris Fillers, MD [facility] 01/23/2024

## 2024-02-21 DEATH — deceased
